# Patient Record
Sex: Female | Born: 1995 | Race: White | Hispanic: No | Marital: Single | Smoking: Never smoker
Health system: Southern US, Community
[De-identification: ages and names within clinical notes are randomized; demographics above are authoritative.]

## PROBLEM LIST (undated history)

## (undated) DIAGNOSIS — C9590 Leukemia, unspecified not having achieved remission: Secondary | ICD-10-CM

## (undated) HISTORY — PX: WISDOM TOOTH EXTRACTION: SHX21

## (undated) HISTORY — PX: APPENDECTOMY: SHX54

---
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
## 2015-01-18 ENCOUNTER — Encounter (HOSPITAL_COMMUNITY): Payer: Self-pay | Admitting: Emergency Medicine

## 2015-01-18 ENCOUNTER — Emergency Department (HOSPITAL_COMMUNITY): Payer: BLUE CROSS/BLUE SHIELD

## 2015-01-18 ENCOUNTER — Emergency Department (HOSPITAL_COMMUNITY)
Admission: EM | Admit: 2015-01-18 | Discharge: 2015-01-18 | Disposition: A | Discharge status: Other Acute Inpatient Hospital | Payer: BLUE CROSS/BLUE SHIELD | Attending: Emergency Medicine | Admitting: Emergency Medicine

## 2015-01-18 DIAGNOSIS — D709 Neutropenia, unspecified: Secondary | ICD-10-CM | POA: Diagnosis not present

## 2015-01-18 DIAGNOSIS — Z856 Personal history of leukemia: Secondary | ICD-10-CM | POA: Diagnosis not present

## 2015-01-18 DIAGNOSIS — R5081 Fever presenting with conditions classified elsewhere: Secondary | ICD-10-CM

## 2015-01-18 DIAGNOSIS — Z792 Long term (current) use of antibiotics: Secondary | ICD-10-CM | POA: Diagnosis not present

## 2015-01-18 DIAGNOSIS — R000 Tachycardia, unspecified: Secondary | ICD-10-CM | POA: Diagnosis not present

## 2015-01-18 DIAGNOSIS — R509 Fever, unspecified: Secondary | ICD-10-CM | POA: Diagnosis present

## 2015-01-18 HISTORY — DX: Leukemia, unspecified not having achieved remission: C95.90

## 2015-01-18 LAB — COMPREHENSIVE METABOLIC PANEL
ALBUMIN: 3.6 g/dL (ref 3.5–5.0)
ALT: 51 U/L (ref 14–54)
AST: 27 U/L (ref 15–41)
Alkaline Phosphatase: 75 U/L (ref 38–126)
Anion gap: 6 (ref 5–15)
BUN: 20 mg/dL (ref 6–20)
CO2: 25 mmol/L (ref 22–32)
CREATININE: 0.79 mg/dL (ref 0.44–1.00)
Calcium: 9.1 mg/dL (ref 8.9–10.3)
Chloride: 106 mmol/L (ref 101–111)
GFR calc Af Amer: >60 mL/min (ref >60)
GFR calc non Af Amer: >60 mL/min (ref >60)
Glucose, Bld: 85 mg/dL (ref 70–99)
POTASSIUM: 3.7 mmol/L (ref 3.5–5.1)
Sodium: 137 mmol/L (ref 135–145)
Total Bilirubin: 0.9 mg/dL (ref 0.3–1.2)
Total Protein: 5.8 g/dL — ABNORMAL LOW (ref 6.5–8.1)

## 2015-01-18 LAB — CBC WITH DIFFERENTIAL/PLATELET
BASOS ABS: 0 10*3/uL (ref 0.0–0.1)
Basophils Relative: 0 % (ref 0–1)
EOS ABS: 0 10*3/uL (ref 0.0–0.7)
Eosinophils Relative: 4 % (ref 0–5)
HCT: 27.2 % — ABNORMAL LOW (ref 36.0–46.0)
Hemoglobin: 9.7 g/dL — ABNORMAL LOW (ref 12.0–15.0)
LYMPHS PCT: 78 % — AB (ref 12–46)
Lymphs Abs: 0.8 10*3/uL (ref 0.7–4.0)
MCH: 29.5 pg (ref 26.0–34.0)
MCHC: 35.7 g/dL (ref 30.0–36.0)
MCV: 82.7 fL (ref 78.0–100.0)
MONO ABS: 0.1 10*3/uL (ref 0.1–1.0)
Monocytes Relative: 6 % (ref 3–12)
Neutro Abs: 0.1 10*3/uL — ABNORMAL LOW (ref 1.7–7.7)
Neutrophils Relative %: 12 % — ABNORMAL LOW (ref 43–77)
PLATELETS: 224 10*3/uL (ref 150–400)
RBC: 3.29 MIL/uL — ABNORMAL LOW (ref 3.87–5.11)
RDW: 12.8 % (ref 11.5–15.5)
WBC: 1 10*3/uL — AB (ref 4.0–10.5)

## 2015-01-18 LAB — URINALYSIS, ROUTINE W REFLEX MICROSCOPIC
Bilirubin Urine: NEGATIVE
GLUCOSE, UA: NEGATIVE mg/dL
Hgb urine dipstick: NEGATIVE
Ketones, ur: NEGATIVE mg/dL
LEUKOCYTES UA: NEGATIVE
Nitrite: NEGATIVE
PH: 6.5 (ref 5.0–8.0)
Protein, ur: NEGATIVE mg/dL
Specific Gravity, Urine: 1.015 (ref 1.005–1.030)
Urobilinogen, UA: 0.2 mg/dL (ref 0.0–1.0)

## 2015-01-18 LAB — I-STAT CG4 LACTIC ACID, ED
LACTIC ACID, VENOUS: 1.14 mmol/L (ref 0.5–2.0)
LACTIC ACID, VENOUS: 0.85 mmol/L (ref 0.5–2.0)

## 2015-01-18 LAB — PREGNANCY, URINE: PREG TEST UR: NEGATIVE

## 2015-01-18 MED ORDER — SODIUM CHLORIDE 0.9 % IV SOLN
1.0000 g | Freq: Three times a day (TID) | INTRAVENOUS | Status: DC
  Administered 2015-01-18: 1 g via INTRAVENOUS
  Filled 2015-01-18 (×2): qty 1

## 2015-01-18 MED ORDER — VANCOMYCIN HCL IN DEXTROSE 1-5 GM/200ML-% IV SOLN
1000.0000 mg | Freq: Once | INTRAVENOUS | Status: AC
  Administered 2015-01-18: 1000 mg via INTRAVENOUS
  Filled 2015-01-18: qty 200

## 2015-01-18 MED ORDER — SODIUM CHLORIDE 0.9 % IV BOLUS (SEPSIS)
500.0000 mL | INTRAVENOUS | Status: AC
  Administered 2015-01-18: 500 mL via INTRAVENOUS

## 2015-01-18 MED ORDER — SODIUM CHLORIDE 0.9 % IV BOLUS (SEPSIS)
1000.0000 mL | Freq: Once | INTRAVENOUS | Status: AC
  Administered 2015-01-18: 1000 mL via INTRAVENOUS

## 2015-01-18 NOTE — ED Notes (Signed)
Pt has B cell ALL leukemia.  Last chemo was Friday.  States that she woke up this morning with a fever of 100.5.  Pt states that she feels SOB.  States that she also is having gen abd pain.  Appendectomy happened during induction chemo.  Has been on zosyn for "quite a while" because she developed an abscess after this.

## 2015-01-18 NOTE — Progress Notes (Signed)
ANTIBIOTIC CONSULT NOTE - INITIAL  Pharmacy Consult for Meropenem Indication: Sepsis  No Known Allergies  Patient Measurements: Height: 5\' 6"  (167.6 cm) Weight: 110 lb (49.896 kg) IBW/kg (Calculated) : 59.3  Vital Signs: Temp: 98.8 F (37.1 C) (05/08 1157) Temp Source: Oral (05/08 1157) BP: 81/59 mmHg (05/08 1331) Pulse Rate: 101 (05/08 1331) Intake/Output from previous day:   Intake/Output from this shift:    Labs:  Recent Labs  01/18/15 1238  WBC 1.0*  HGB 9.7*  PLT 224  CREATININE 0.79   Estimated Creatinine Clearance: 89.8 mL/min (by C-G formula based on Cr of 0.79). No results for input(s): VANCOTROUGH, VANCOPEAK, VANCORANDOM, GENTTROUGH, GENTPEAK, GENTRANDOM, TOBRATROUGH, TOBRAPEAK, TOBRARND, AMIKACINPEAK, AMIKACINTROU, AMIKACIN in the last 72 hours.   Microbiology: No results found for this or any previous visit (from the past 720 hour(s)).  Medical History: Past Medical History  Diagnosis Date  . Leukemia     Medications:  Anti-infectives    Start     Dose/Rate Route Frequency Ordered Stop   01/18/15 1400  vancomycin (VANCOCIN) IVPB 1000 mg/200 mL premix     1,000 mg 200 mL/hr over 60 Minutes Intravenous  Once 01/18/15 1357       Assessment: 19yo F with ALL being treated at [facility], most recently treated on 5/6, presenting with fever and SOB. Vanc is ordered x 1 and pharmacy is asked to assist with meropenem dosing.   Afebrile.  WBCs low, ANC 0.1.  SCr wnl, CrCl 33ml/min.  Blood and urine cultures collected.  Goal of Therapy:  Appropriate antibiotic dosing for renal function; eradication of infection  Plan:  Agree with Meropenem 1g IV q8h as ordered. Follow up renal fxn, culture results, and clinical course.  Romeo Rabon, PharmD, pager [phone]. 01/18/2015,2:05 PM.

## 2015-01-18 NOTE — ED Provider Notes (Signed)
CSN: [id number]     Arrival date & time 01/18/15  1150 History   First MD Initiated Contact with Patient 01/18/15 1154     Chief Complaint  Patient presents with  . Fever  . Tachycardia  . Leukemia     (Consider location/radiation/quality/duration/timing/severity/associated sxs/prior Treatment) HPI Patient presents with concern of fever, weakness, dyspnea.  Dyspnea is worse w exertion. Patient has leukemia, is actively receiving chemotherapy. Last session was 2 days ago. Patient receives care at [facility]. Stated patient found herself to be febrile, after awakening. No new pain, though she has mild abdominal discomfort, generalized. No new bowel habit changes, baseline loose stool since initiation of chemotherapy, and recent appendectomy. Patient has 3 times daily Zosyn dosing.  Past Medical History  Diagnosis Date  . Leukemia    Past Surgical History  Procedure Laterality Date  . Wisdom tooth extraction    . Appendectomy     No family history on file. History  Substance Use Topics  . Smoking status: Never Smoker   . Smokeless tobacco: Not on file  . Alcohol Use: No     Comment: occasionally   OB History    No data available     Review of Systems  Constitutional:       Per HPI, otherwise negative  HENT:       Per HPI, otherwise negative  Respiratory:       Per HPI, otherwise negative  Cardiovascular:       Per HPI, otherwise negative  Gastrointestinal: Positive for diarrhea. Negative for vomiting.  Endocrine:       Negative aside from HPI  Genitourinary:       Neg aside from HPI   Musculoskeletal:       Per HPI, otherwise negative  Skin: Negative.   Neurological: Positive for weakness and light-headedness. Negative for syncope.      Allergies  Review of patient's allergies indicates no known allergies.  Home Medications   Prior to Admission medications   Medication Sig Start Date End Date Taking? Authorizing Provider  ACZONE 5 % topical gel Apply 1  application topically at bedtime.  09/28/14   Historical Provider, MD  ampicillin (PRINCIPEN) 500 MG capsule Take 1 capsule by mouth daily. For acne 10/28/14   Historical Provider, MD  ibuprofen (ADVIL,MOTRIN) 200 MG tablet Take 400 mg by mouth every 6 (six) hours as needed for moderate pain.    Historical Provider, MD   BP 94/73 mmHg  Pulse 120  Temp(Src) 98.8 F (37.1 C) (Oral)  Resp 20  SpO2 96% Physical Exam  Constitutional: She is oriented to person, place, and time. She appears well-developed and well-nourished. No distress.  HENT:  Head: Normocephalic and atraumatic.  Eyes: Conjunctivae and EOM are normal.  Cardiovascular: Regular rhythm.  Tachycardia present.   Pulmonary/Chest: Effort normal and breath sounds normal. No stridor. No respiratory distress.    Abdominal: She exhibits no distension.  Mild diffuse ttp, no guarding.  Musculoskeletal: She exhibits no edema.  Neurological: She is alert and oriented to person, place, and time. No cranial nerve deficit.  Skin: Skin is warm and dry.  Psychiatric: She has a normal mood and affect.  Nursing note and vitals reviewed.   ED Course  Procedures (including critical care time) Labs Review Labs Reviewed  CBC WITH DIFFERENTIAL/PLATELET - Abnormal; Notable for the following:    WBC 1.0 (*)    RBC 3.29 (*)    Hemoglobin 9.7 (*)    HCT 27.2 (*)  Neutrophils Relative % 12 (*)    Lymphocytes Relative 78 (*)    Neutro Abs 0.1 (*)    All other components within normal limits  COMPREHENSIVE METABOLIC PANEL - Abnormal; Notable for the following:    Total Protein 5.8 (*)    All other components within normal limits  CULTURE, BLOOD (ROUTINE X 2)  CULTURE, BLOOD (ROUTINE X 2)  URINE CULTURE  URINALYSIS, ROUTINE W REFLEX MICROSCOPIC  PREGNANCY, URINE  I-STAT CG4 LACTIC ACID, ED  I-STAT CG4 LACTIC ACID, ED    Imaging Review Dg Chest Port 1 View  01/18/2015   CLINICAL DATA:  Shortness of breath and fever today, history of  leukemia with last chemotherapy treatment on 01/16/2015  EXAM: PORTABLE CHEST - 1 VIEW  COMPARISON:  None  FINDINGS: LEFT subclavian Port-A-Cath with tip projecting over mid SVC.  Normal heart size, mediastinal contours and pulmonary vascularity.  Lungs clear.  No pleural effusion or pneumothorax.  Osseous structures unremarkable.  IMPRESSION: No acute abnormalities.   Electronically Signed   By: Lavonia Dana M.D.   On: 01/18/2015 14:08     EKG Interpretation   Date/Time:  Sunday Jan 18 2015 12:09:15 EDT Ventricular Rate:  121 PR Interval:  108 QRS Duration: 83 QT Interval:  386 QTC Calculation: 548 R Axis:   36 Text Interpretation:  Sinus tachycardia Borderline T abnormalities,  diffuse leads Prolonged QT interval Baseline wander in lead(s) V2 Sinus  tachycardia Artifact Abnormal ekg Confirmed by Carmin Muskrat  MD ([id number])  on 01/18/2015 12:26:43 PM     Cardiac: 121- st, abn O2- 99%ra, nml  BP initial low - IVF started  EMR review of OSH w leukopenia 2d ago.  Following return of initial labs, I rechecked the patient.  She remained tachycardic / hypotensive.  Sepsis had been called soon after her initial eval.   I spoke w [facility] peds oncology regarding her care.  We agreed on the broad scope of ABX for her neutropenic fever and transfer to tertiary care.  On re-exam, prior to transfer, the patient remained hypotensive, though awake and alert.  Parent have been involved throughout.  MDM   Final diagnoses:  Neutropenic fever   Young F w leukemia now p/w fever.  Initial VS consistent w sepsis, and with concern for relative immunosuppression 2/2 chemo, isolation, broad spectrum ABX provided.  Case discussed several times with the patient's oncology team at [facility].  With her need for higher level of care by those on her primary team, I arranged transfer.  CRITICAL CARE Performed by: Carmin Muskrat Total critical care time: 45 Critical care time was exclusive of separately billable  procedures and treating other patients. Critical care was necessary to treat or prevent imminent or life-threatening deterioration. Critical care was time spent personally by me on the following activities: development of treatment plan with patient and/or surrogate as well as nursing, discussions with consultants, evaluation of patient's response to treatment, examination of patient, obtaining history from patient or surrogate, ordering and performing treatments and interventions, ordering and review of laboratory studies, ordering and review of radiographic studies, pulse oximetry and re-evaluation of patient's condition.    Carmin Muskrat, MD 01/18/15 2320

## 2015-01-18 NOTE — ED Notes (Signed)
Dr Carney Harder [facility] [phone] called and has requested Blood cultures and CBC be obtained. Please notify her with results and/or questions.

## 2015-01-18 NOTE — ED Notes (Signed)
Waiting on nurse to apply pain spray and i will get second set of blood cultures

## 2015-01-18 NOTE — ED Notes (Signed)
Pt port already access with saline lock upon arrival. Pt reports that port was accessed at [facility] 2 days ago.

## 2015-01-18 NOTE — ED Notes (Addendum)
[facility] provided with image disk for [facility]. Report called to [facility] at [facility] and [place] notified that pt is on her way with transport.

## 2015-01-19 LAB — URINE CULTURE
CULTURE: NO GROWTH
Colony Count: NO GROWTH

## 2015-01-24 LAB — CULTURE, BLOOD (ROUTINE X 2)
CULTURE: NO GROWTH
Culture: NO GROWTH

## 2015-02-03 ENCOUNTER — Other Ambulatory Visit (HOSPITAL_COMMUNITY)
Admit: 2015-02-03 | Discharge: 2015-02-03 | Disposition: A | Discharge status: Home / Self Care | Payer: BLUE CROSS/BLUE SHIELD | Source: Other Acute Inpatient Hospital | Attending: Pediatric Hematology | Admitting: Pediatric Hematology

## 2015-02-03 DIAGNOSIS — R197 Diarrhea, unspecified: Secondary | ICD-10-CM | POA: Diagnosis not present

## 2015-02-03 LAB — CLOSTRIDIUM DIFFICILE BY PCR: Toxigenic C. Difficile by PCR: NEGATIVE

## 2015-02-05 ENCOUNTER — Encounter (HOSPITAL_COMMUNITY): Payer: Self-pay | Admitting: *Deleted

## 2015-02-05 ENCOUNTER — Emergency Department (HOSPITAL_COMMUNITY)
Admission: EM | Admit: 2015-02-05 | Discharge: 2015-02-05 | Disposition: A | Discharge status: Other Acute Inpatient Hospital | Payer: BLUE CROSS/BLUE SHIELD | Attending: Emergency Medicine | Admitting: Emergency Medicine

## 2015-02-05 DIAGNOSIS — R1031 Right lower quadrant pain: Secondary | ICD-10-CM | POA: Insufficient documentation

## 2015-02-05 DIAGNOSIS — Z79899 Other long term (current) drug therapy: Secondary | ICD-10-CM | POA: Diagnosis not present

## 2015-02-05 DIAGNOSIS — Z792 Long term (current) use of antibiotics: Secondary | ICD-10-CM | POA: Insufficient documentation

## 2015-02-05 DIAGNOSIS — R5383 Other fatigue: Secondary | ICD-10-CM | POA: Diagnosis not present

## 2015-02-05 DIAGNOSIS — R42 Dizziness and giddiness: Secondary | ICD-10-CM | POA: Insufficient documentation

## 2015-02-05 DIAGNOSIS — R1032 Left lower quadrant pain: Secondary | ICD-10-CM | POA: Diagnosis not present

## 2015-02-05 DIAGNOSIS — R0602 Shortness of breath: Secondary | ICD-10-CM | POA: Diagnosis not present

## 2015-02-05 DIAGNOSIS — Z3202 Encounter for pregnancy test, result negative: Secondary | ICD-10-CM | POA: Insufficient documentation

## 2015-02-05 DIAGNOSIS — R55 Syncope and collapse: Secondary | ICD-10-CM | POA: Insufficient documentation

## 2015-02-05 DIAGNOSIS — Z856 Personal history of leukemia: Secondary | ICD-10-CM | POA: Insufficient documentation

## 2015-02-05 DIAGNOSIS — R110 Nausea: Secondary | ICD-10-CM | POA: Diagnosis not present

## 2015-02-05 LAB — BASIC METABOLIC PANEL
Anion gap: 10 (ref 5–15)
BUN: 18 mg/dL (ref 6–20)
CHLORIDE: 99 mmol/L — AB (ref 101–111)
CO2: 28 mmol/L (ref 22–32)
CREATININE: 0.84 mg/dL (ref 0.44–1.00)
Calcium: 8.6 mg/dL — ABNORMAL LOW (ref 8.9–10.3)
GFR calc Af Amer: >60 mL/min (ref >60)
GFR calc non Af Amer: >60 mL/min (ref >60)
GLUCOSE: 96 mg/dL (ref 65–99)
Potassium: 4.3 mmol/L (ref 3.5–5.1)
Sodium: 137 mmol/L (ref 135–145)

## 2015-02-05 LAB — URINALYSIS, ROUTINE W REFLEX MICROSCOPIC
Bilirubin Urine: NEGATIVE
GLUCOSE, UA: NEGATIVE mg/dL
HGB URINE DIPSTICK: NEGATIVE
Ketones, ur: NEGATIVE mg/dL
Leukocytes, UA: NEGATIVE
NITRITE: NEGATIVE
PH: 7 (ref 5.0–8.0)
Protein, ur: NEGATIVE mg/dL
Specific Gravity, Urine: 1.007 (ref 1.005–1.030)
Urobilinogen, UA: 0.2 mg/dL (ref 0.0–1.0)

## 2015-02-05 LAB — CBC WITH DIFFERENTIAL/PLATELET
BASOS ABS: 0 10*3/uL (ref 0.0–0.1)
Basophils Relative: 0 % (ref 0–1)
Eosinophils Absolute: 0.2 10*3/uL (ref 0.0–0.7)
Eosinophils Relative: 3 % (ref 0–5)
HCT: 29.3 % — ABNORMAL LOW (ref 36.0–46.0)
HEMOGLOBIN: 9.5 g/dL — AB (ref 12.0–15.0)
LYMPHS PCT: 20 % (ref 12–46)
Lymphs Abs: 1 10*3/uL (ref 0.7–4.0)
MCH: 29.4 pg (ref 26.0–34.0)
MCHC: 32.4 g/dL (ref 30.0–36.0)
MCV: 90.7 fL (ref 78.0–100.0)
MONOS PCT: 3 % (ref 3–12)
Monocytes Absolute: 0.2 10*3/uL (ref 0.1–1.0)
Neutro Abs: 3.5 10*3/uL (ref 1.7–7.7)
Neutrophils Relative %: 74 % (ref 43–77)
PLATELETS: 190 10*3/uL (ref 150–400)
RBC: 3.23 MIL/uL — ABNORMAL LOW (ref 3.87–5.11)
RDW: 14.7 % (ref 11.5–15.5)
WBC: 4.8 10*3/uL (ref 4.0–10.5)

## 2015-02-05 LAB — HEPATIC FUNCTION PANEL
ALT: 211 U/L — ABNORMAL HIGH (ref 14–54)
AST: 92 U/L — ABNORMAL HIGH (ref 15–41)
Albumin: 3 g/dL — ABNORMAL LOW (ref 3.5–5.0)
Alkaline Phosphatase: 68 U/L (ref 38–126)
BILIRUBIN DIRECT: 0.1 mg/dL (ref 0.1–0.5)
Indirect Bilirubin: 0.6 mg/dL (ref 0.3–0.9)
Total Bilirubin: 0.7 mg/dL (ref 0.3–1.2)
Total Protein: 5.3 g/dL — ABNORMAL LOW (ref 6.5–8.1)

## 2015-02-05 LAB — POC URINE PREG, ED: Preg Test, Ur: NEGATIVE

## 2015-02-05 MED ORDER — SODIUM CHLORIDE 0.9 % IV SOLN
INTRAVENOUS | Status: DC
  Administered 2015-02-05: 22:00:00 via INTRAVENOUS

## 2015-02-05 MED ORDER — ONDANSETRON HCL 4 MG/2ML IJ SOLN
4.0000 mg | Freq: Once | INTRAMUSCULAR | Status: AC
Start: 2015-02-05 — End: 2015-02-05
  Administered 2015-02-05: 4 mg via INTRAVENOUS
  Filled 2015-02-05: qty 2

## 2015-02-05 MED ORDER — VANCOMYCIN HCL IN DEXTROSE 1-5 GM/200ML-% IV SOLN
1000.0000 mg | Freq: Once | INTRAVENOUS | Status: AC
Start: 2015-02-05 — End: 2015-02-05
  Administered 2015-02-05: 1000 mg via INTRAVENOUS
  Filled 2015-02-05: qty 200

## 2015-02-05 MED ORDER — SODIUM CHLORIDE 0.9 % IV BOLUS (SEPSIS)
1000.0000 mL | Freq: Once | INTRAVENOUS | Status: AC
  Administered 2015-02-05: 1000 mL via INTRAVENOUS

## 2015-02-05 MED ORDER — LORAZEPAM 0.5 MG PO TABS
0.5000 mg | ORAL_TABLET | Freq: Once | ORAL | Status: AC
  Administered 2015-02-05: 0.5 mg via ORAL
  Filled 2015-02-05: qty 1

## 2015-02-05 MED ORDER — PIPERACILLIN-TAZOBACTAM 3.375 G IVPB
3.3750 g | Freq: Once | INTRAVENOUS | Status: AC
  Administered 2015-02-05: 3.375 g via INTRAVENOUS
  Filled 2015-02-05: qty 50

## 2015-02-05 MED ORDER — ONDANSETRON HCL 4 MG/2ML IJ SOLN
4.0000 mg | Freq: Once | INTRAMUSCULAR | Status: AC
  Administered 2015-02-05: 4 mg via INTRAVENOUS
  Filled 2015-02-05: qty 2

## 2015-02-05 NOTE — ED Notes (Signed)
[facility], pt is still waiting on bed placement at this time, family updated on the same

## 2015-02-05 NOTE — ED Notes (Signed)
Pt c/o nausea at this time, Dr. Tomi Bamberger notified of the same, new order for zofran. Pt/family is aware we are currently awaiting bed placement/transport to [facility] for admission

## 2015-02-05 NOTE — ED Notes (Signed)
Report had been called [facility], and [facility] called. [facility] called back and reported transport is on hold at this time due to bed assignment change. [facility] notified of the same. Will update family at this time

## 2015-02-05 NOTE — ED Provider Notes (Signed)
CSN: [id number]     Arrival date & time 02/05/15  1344 History   First MD Initiated Contact with Patient 02/05/15 1355     Chief Complaint  Patient presents with  . Loss of Consciousness     (Consider location/radiation/quality/duration/timing/severity/associated sxs/prior Treatment) Patient is a 19 y.o. female presenting with syncope.  Loss of Consciousness Episode history:  Multiple Most recent episode:  Today Duration: a few seconds. Timing:  Intermittent Progression:  Resolved Chronicity:  New Context comment:  Hx of ALL, on chemo, also being treated for intraabdominal fluid collections with IV cefepime (last dose last night) Witnessed: yes (second episode witnessed by mother)   Worsened by:  Posture Associated symptoms: dizziness, malaise/fatigue, nausea and shortness of breath (occasionally, on exertion, felt by patient to be secondary to deconditioning.)   Associated symptoms: no chest pain, no fever, no focal weakness and no vomiting     Past Medical History  Diagnosis Date  . Leukemia    Past Surgical History  Procedure Laterality Date  . Wisdom tooth extraction    . Appendectomy     No family history on file. History  Substance Use Topics  . Smoking status: Never Smoker   . Smokeless tobacco: Not on file  . Alcohol Use: No     Comment: occasionally   OB History    No data available     Review of Systems  Constitutional: Positive for malaise/fatigue. Negative for fever.  Respiratory: Positive for shortness of breath (occasionally, on exertion, felt by patient to be secondary to deconditioning.).   Cardiovascular: Positive for syncope. Negative for chest pain.  Gastrointestinal: Positive for nausea. Negative for vomiting.  Neurological: Positive for dizziness. Negative for focal weakness.  All other systems reviewed and are negative.     Allergies  Review of patient's allergies indicates no known allergies.  Home Medications   Prior to Admission  medications   Medication Sig Start Date End Date Taking? Authorizing Provider  ACZONE 5 % topical gel Apply 1 application topically at bedtime.  09/28/14   Historical Provider, MD  Amino Acids (AMINO ACID PO) Take 1 capsule by mouth 2 (two) times daily.    Historical Provider, MD  cytarabine, PF, (ARA-C) 100 MG/ML injection Dose = 112 mg. IV push over 1 minute. Give on 4/16 to 4/18, and then 4/23 to 4/25. 12/26/14   Historical Provider, MD  dronabinol (MARINOL) 5 MG capsule Take 5 mg by mouth 2 (two) times daily before lunch and supper.  12/23/14   Historical Provider, MD  fluconazole (DIFLUCAN) 200 MG tablet Take 200 mg by mouth daily.  01/05/15   Historical Provider, MD  ibuprofen (ADVIL,MOTRIN) 200 MG tablet Take 400 mg by mouth every 6 (six) hours as needed for moderate pain.    Historical Provider, MD  leuprolide (LUPRON) 11.25 MG injection Inject 11.25 mg into the muscle every 3 (three) months.    Historical Provider, MD  levofloxacin (LEVAQUIN) 500 MG tablet Take 500 mg by mouth daily.  12/14/14   Historical Provider, MD  lidocaine-prilocaine (EMLA) cream Apply 1 application topically once.  11/10/14   Historical Provider, MD  Linaclotide Rolan Lipa) 145 MCG CAPS capsule Take 145 mcg by mouth daily.  12/23/14 01/22/15  Historical Provider, MD  loratadine (CLARITIN) 10 MG tablet Take 10 mg by mouth daily.    Historical Provider, MD  LORazepam (ATIVAN) 0.5 MG tablet Take 1-2 tabs by mouth every 6 hours as needed for nausea 11/13/14   Historical Provider, MD  mercaptopurine (PURINETHOL) 50 MG tablet Take orally at bedtime. Take 2 tablets on Mon-Thurs, & take 1.5 tablets on Fri - Sun. Take from 4/15 to 4/28, and from 5/13 to 5/26. 12/26/14   Historical Provider, MD  methotrexate (PF) 12 mg in sodium chloride 0.9 % 15 mg by Intrathecal route once.    Historical Provider, MD  ondansetron (ZOFRAN) 8 MG tablet Take 8 mg by mouth every 8 (eight) hours as needed for nausea or vomiting.  12/23/14   Historical Provider,  MD  polyethylene glycol (MIRALAX / GLYCOLAX) packet Take 17 g by mouth daily as needed for mild constipation.     Historical Provider, MD  scopolamine (TRANSDERM-SCOP) 1 MG/3DAYS Place 1.5 mg onto the skin every 3 (three) days.  12/23/14 12/23/15  Historical Provider, MD  sulfamethoxazole-trimethoprim (BACTRIM DS,SEPTRA DS) 800-160 MG per tablet Take 1 tablet by mouth See admin instructions. Take 1 tablet twice daily 01/16/15   Historical Provider, MD  vinCRIStine 2 mg in sodium chloride 0.9 % 50 mL Inject 2 mg into the vein once.    Historical Provider, MD   BP 114/79 mmHg  Pulse 88  Temp(Src) 98.4 F (36.9 C) (Oral)  Resp 17  SpO2 99% Physical Exam  Constitutional: She is oriented to person, place, and time. She appears well-developed and well-nourished. No distress.  HENT:  Head: Normocephalic and atraumatic.  Mouth/Throat: Oropharynx is clear and moist.  Eyes: Conjunctivae are normal. Pupils are equal, round, and reactive to light. No scleral icterus.  Neck: Neck supple.  Cardiovascular: Normal rate, regular rhythm, normal heart sounds and intact distal pulses.   No murmur heard. Pulmonary/Chest: Effort normal and breath sounds normal. No stridor. No respiratory distress. She has no rales.  Abdominal: Soft. Bowel sounds are normal. She exhibits no distension. There is tenderness (mild) in the right lower quadrant and suprapubic area. There is no rigidity, no rebound and no guarding.  Musculoskeletal: Normal range of motion.  Neurological: She is alert and oriented to person, place, and time.  Skin: Skin is warm and dry. No rash noted.  Psychiatric: She has a normal mood and affect. Her behavior is normal.  Nursing note and vitals reviewed.   ED Course  Procedures (including critical care time) Labs Review Labs Reviewed  CBC WITH DIFFERENTIAL/PLATELET - Abnormal; Notable for the following:    RBC 3.23 (*)    Hemoglobin 9.5 (*)    HCT 29.3 (*)    All other components within normal  limits  BASIC METABOLIC PANEL - Abnormal; Notable for the following:    Chloride 99 (*)    Calcium 8.6 (*)    All other components within normal limits  HEPATIC FUNCTION PANEL - Abnormal; Notable for the following:    Total Protein 5.3 (*)    Albumin 3.0 (*)    AST 92 (*)    ALT 211 (*)    All other components within normal limits  CULTURE, BLOOD (SINGLE)  URINALYSIS, ROUTINE W REFLEX MICROSCOPIC (NOT AT [facility])  POC URINE PREG, ED    Imaging Review No results found.   EKG Interpretation   Date/Time:  Thursday Feb 05 2015 17:28:14 EDT Ventricular Rate:  79 PR Interval:  169 QRS Duration: 89 QT Interval:  385 QTC Calculation: 441 R Axis:   66 Text Interpretation:  Sinus rhythm RSR' in V1 or V2, right VCD or RVH  borderline t wave abnormality improved compared to prior. QTc normal.   Confirmed by [facility]  MD, TREY ([id number]) on  02/05/2015 5:31:26 PM      MDM   Final diagnoses:  Syncope, unspecified syncope type    19 yo female with hx of ALL presenting after syncope.  Occurred twice in succession after standing up and then after sitting up.  She bumped her head during the first episode and was lowered to the ground by mom during the second.    From a head injury perspective, I don't think she needs imaging.  She is well appearing, alert, no vomiting, no headache, no evidence of head trauma, and no head tenderness.  Likewise with her c spine.  She has a normal platelet count.    From a syncope perspective, I spoke with her oncology team Jarrett Soho) from [facility], who offered recommendations based on their previous history with pt.  She recommended blood culture, IV Vanco and Zosyn, fluids, and anticipated transfer to [facility].    She appears well appearing and nontoxic.  She does not have an acute abdomen or even significant tenderness.  I spoke with Dr. Cheri Rous at [facility], who has accepted pt in transfer.  ,4:55 PM On recheck, BP has dropped to 87/45.  She remains well appearing, nontoxic, with  normal HR.  Will give another bolus and watch.  Care transferred to Dr. Leonides Schanz pending her transfer to [facility].      Serita Grit, MD 02/05/15 [phone]

## 2015-02-05 NOTE — ED Notes (Addendum)
Pt from home.  Hx leukemia and on oral chemo.  Per ems report, pt started new appetite stimulating med yesterday and had 2 syncopal episodes today.  On ems arrival, VSS, AxOx4, ST 108-118, , 18, 100%RA, CBG 90. Per patient she had been having diarrhea x 3 days from abx she had been taking.  Her abx was d/c'd by her MD and today her stool was normal.  Pt reports dizziness and states she feels she hit her head pretty hard when she passed out earlier.  States her Marinol sometimes makes her dizzy. Currently she is axox4 and that her vision is a bit blurry and her mouth is dry and tailbone is sore from falling.

## 2015-02-05 NOTE — ED Notes (Signed)
ED Provider at patient bedside for assessment/update.   

## 2015-02-11 LAB — CULTURE, BLOOD (SINGLE): Culture: NO GROWTH

## 2015-06-18 ENCOUNTER — Emergency Department (HOSPITAL_COMMUNITY)
Admission: EM | Admit: 2015-06-18 | Discharge: 2015-06-18 | Payer: BLUE CROSS/BLUE SHIELD | Attending: Emergency Medicine | Admitting: Emergency Medicine

## 2015-06-18 ENCOUNTER — Encounter (HOSPITAL_COMMUNITY): Payer: Self-pay | Admitting: Family Medicine

## 2015-06-18 DIAGNOSIS — Z79899 Other long term (current) drug therapy: Secondary | ICD-10-CM | POA: Insufficient documentation

## 2015-06-18 DIAGNOSIS — R5383 Other fatigue: Secondary | ICD-10-CM | POA: Insufficient documentation

## 2015-06-18 DIAGNOSIS — R61 Generalized hyperhidrosis: Secondary | ICD-10-CM | POA: Insufficient documentation

## 2015-06-18 DIAGNOSIS — R630 Anorexia: Secondary | ICD-10-CM | POA: Insufficient documentation

## 2015-06-18 DIAGNOSIS — R6883 Chills (without fever): Secondary | ICD-10-CM | POA: Insufficient documentation

## 2015-06-18 DIAGNOSIS — R112 Nausea with vomiting, unspecified: Secondary | ICD-10-CM | POA: Insufficient documentation

## 2015-06-18 DIAGNOSIS — R1084 Generalized abdominal pain: Secondary | ICD-10-CM | POA: Diagnosis not present

## 2015-06-18 DIAGNOSIS — D701 Agranulocytosis secondary to cancer chemotherapy: Secondary | ICD-10-CM | POA: Diagnosis not present

## 2015-06-18 DIAGNOSIS — E872 Acidosis, unspecified: Secondary | ICD-10-CM

## 2015-06-18 DIAGNOSIS — C9590 Leukemia, unspecified not having achieved remission: Secondary | ICD-10-CM | POA: Diagnosis not present

## 2015-06-18 DIAGNOSIS — R231 Pallor: Secondary | ICD-10-CM | POA: Diagnosis not present

## 2015-06-18 DIAGNOSIS — T451X5A Adverse effect of antineoplastic and immunosuppressive drugs, initial encounter: Secondary | ICD-10-CM

## 2015-06-18 DIAGNOSIS — R531 Weakness: Secondary | ICD-10-CM | POA: Diagnosis not present

## 2015-06-18 LAB — CBC WITH DIFFERENTIAL/PLATELET
BASOS ABS: 0 10*3/uL (ref 0.0–0.1)
Basophils Relative: 1 %
Eosinophils Absolute: 0 10*3/uL (ref 0.0–0.7)
Eosinophils Relative: 0 %
HCT: 38.7 % (ref 36.0–46.0)
Hemoglobin: 13.4 g/dL (ref 12.0–15.0)
LYMPHS ABS: 1.5 10*3/uL (ref 0.7–4.0)
Lymphocytes Relative: 80 %
MCH: 33 pg (ref 26.0–34.0)
MCHC: 34.6 g/dL (ref 30.0–36.0)
MCV: 95.3 fL (ref 78.0–100.0)
Monocytes Absolute: 0.1 10*3/uL (ref 0.1–1.0)
Monocytes Relative: 4 %
Neutro Abs: 0.3 10*3/uL — ABNORMAL LOW (ref 1.7–7.7)
Neutrophils Relative %: 15 %
Platelets: 28 10*3/uL — CL (ref 150–400)
RBC: 4.06 MIL/uL (ref 3.87–5.11)
RDW: 17.9 % — AB (ref 11.5–15.5)
WBC: 1.9 10*3/uL — AB (ref 4.0–10.5)

## 2015-06-18 LAB — COMPREHENSIVE METABOLIC PANEL
ALT: 103 U/L — ABNORMAL HIGH (ref 14–54)
AST: 54 U/L — AB (ref 15–41)
Albumin: 3.1 g/dL — ABNORMAL LOW (ref 3.5–5.0)
Alkaline Phosphatase: 83 U/L (ref 38–126)
Anion gap: 10 (ref 5–15)
BILIRUBIN TOTAL: 0.5 mg/dL (ref 0.3–1.2)
BUN: 36 mg/dL — AB (ref 6–20)
CHLORIDE: 96 mmol/L — AB (ref 101–111)
CO2: 26 mmol/L (ref 22–32)
CREATININE: 0.79 mg/dL (ref 0.44–1.00)
Calcium: 8.1 mg/dL — ABNORMAL LOW (ref 8.9–10.3)
GFR calc non Af Amer: >60 mL/min (ref >60)
Glucose, Bld: 94 mg/dL (ref 65–99)
POTASSIUM: 4.3 mmol/L (ref 3.5–5.1)
Sodium: 132 mmol/L — ABNORMAL LOW (ref 135–145)
TOTAL PROTEIN: 4.9 g/dL — AB (ref 6.5–8.1)

## 2015-06-18 LAB — LIPASE, BLOOD: LIPASE: 15 U/L — AB (ref 22–51)

## 2015-06-18 LAB — I-STAT CG4 LACTIC ACID, ED: LACTIC ACID, VENOUS: 3.2 mmol/L — AB (ref 0.5–2.0)

## 2015-06-18 MED ORDER — SODIUM CHLORIDE 0.9 % IV BOLUS (SEPSIS)
1000.0000 mL | Freq: Once | INTRAVENOUS | Status: AC
  Administered 2015-06-18: 1000 mL via INTRAVENOUS

## 2015-06-18 MED ORDER — PIPERACILLIN-TAZOBACTAM 3.375 G IVPB
3.3750 g | Freq: Once | INTRAVENOUS | Status: AC
  Administered 2015-06-18: 3.375 g via INTRAVENOUS
  Filled 2015-06-18: qty 50

## 2015-06-18 MED ORDER — ONDANSETRON HCL 4 MG/2ML IJ SOLN
4.0000 mg | Freq: Once | INTRAMUSCULAR | Status: AC
  Administered 2015-06-18: 4 mg via INTRAVENOUS
  Filled 2015-06-18: qty 2

## 2015-06-18 MED ORDER — LORAZEPAM 2 MG/ML IJ SOLN
1.0000 mg | Freq: Once | INTRAMUSCULAR | Status: AC
  Administered 2015-06-18: 1 mg via INTRAVENOUS
  Filled 2015-06-18: qty 1

## 2015-06-18 MED ORDER — ACETAMINOPHEN 500 MG PO TABS: 500.0000 mg | ORAL_TABLET | Freq: Once | ORAL | Status: DC

## 2015-06-18 MED ORDER — ACETAMINOPHEN 160 MG/5ML PO SOLN
500.0000 mg | Freq: Once | ORAL | Status: AC
  Administered 2015-06-18: 500 mg via ORAL
  Filled 2015-06-18: qty 20

## 2015-06-18 MED ORDER — DEXAMETHASONE SODIUM PHOSPHATE 4 MG/ML IJ SOLN
4.0000 mg | Freq: Once | INTRAMUSCULAR | Status: AC
  Administered 2015-06-18: 4 mg via INTRAVENOUS
  Filled 2015-06-18: qty 1

## 2015-06-18 NOTE — ED Notes (Signed)
Bed: WU88 Expected date:  Expected time:  Means of arrival:  Comments: EMS 19yo F abd pain / vomiting

## 2015-06-18 NOTE — ED Notes (Signed)
Patient is from home and transported by [facility]. Pt has leukemia and seeking chemo. Last chemo treatment was 2 weeks ago. Pt has a new onset of nausea, vomiting, abd pain, and joint pain. Started yesterday, mid day. Pt states she completed the dosages of steroids.  Pt was given ZOFRAN 4 mg IV enroute by EMS.

## 2015-06-18 NOTE — ED Notes (Signed)
Report given to Gerald Stabs, Therapist, sports at [facility]

## 2015-06-18 NOTE — ED Provider Notes (Signed)
CSN: [id number]     Arrival date & time 06/18/15  1856 History   First MD Initiated Contact with Patient 06/18/15 [phone]     Chief Complaint  Patient presents with  . Emesis  . Joint Pain  . Abdominal Pain     (Consider location/radiation/quality/duration/timing/severity/associated sxs/prior Treatment) HPI Young female with leukemia presents with diffuse myalgia, arthralgia, nausea, weakness, vomiting. Last chemotherapy session was 2 weeks ago. This episode of illness began 2 days ago, since onset has been worsening. No focal pain, though there is diffuse abdominal soreness. No confusion, disorientation, syncope. No diarrhea, last bowel movement was yesterday, unremarkable. In addition to the patient's ongoing chemotherapy, patient had previously been on chronic dexamethasone. Last dose 3 days ago. Since onset of illness, no relief with anything, including Zofran. Patient is accompanied by her mother who assists with the history of present illness.  Past Medical History  Diagnosis Date  . Leukemia [facility])    Past Surgical History  Procedure Laterality Date  . Wisdom tooth extraction    . Appendectomy     History reviewed. No pertinent family history. Social History  Substance Use Topics  . Smoking status: Never Smoker   . Smokeless tobacco: None  . Alcohol Use: No   OB History    No data available     Review of Systems  Constitutional: Positive for chills, diaphoresis, activity change, appetite change and fatigue.  HENT:       Per HPI, otherwise negative  Respiratory:       Per HPI, otherwise negative  Cardiovascular:       Per HPI, otherwise negative  Gastrointestinal: Positive for nausea and vomiting.  Endocrine:       Negative aside from HPI  Genitourinary:       Neg aside from HPI   Musculoskeletal:       Per HPI, otherwise negative  Skin: Positive for pallor.  Allergic/Immunologic: Positive for immunocompromised state.  Neurological: Positive for  weakness. Negative for syncope.      Allergies  Ibuprofen  Home Medications   Prior to Admission medications   Medication Sig Start Date End Date Taking? Authorizing Provider  ACZONE 5 % topical gel Apply 1 application topically at bedtime as needed (acne).  09/28/14   Historical Provider, MD  Amino Acids (AMINO ACID PO) Take 1 capsule by mouth 2 (two) times daily.    Historical Provider, MD  cytarabine, PF, (ARA-C) 100 MG/ML injection Dose = 112 mg. IV push over 1 minute. Give on 4/16 to 4/18, and then 4/23 to 4/25. 12/26/14   Historical Provider, MD  dronabinol (MARINOL) 5 MG capsule Take 5 mg by mouth 2 (two) times daily as needed (appetite).  12/23/14   Historical Provider, MD  famotidine (PEPCID) 20 MG tablet Take 20 mg by mouth 2 (two) times daily.    Historical Provider, MD  fluconazole (DIFLUCAN) 200 MG tablet Take 200 mg by mouth daily.  01/05/15   Historical Provider, MD  leuprolide (LUPRON) 11.25 MG injection Inject 11.25 mg into the muscle every 3 (three) months.    Historical Provider, MD  levofloxacin (LEVAQUIN) 500 MG tablet Take 500 mg by mouth daily.  12/14/14   Historical Provider, MD  lidocaine-prilocaine (EMLA) cream Apply 1 application topically once.  11/10/14   Historical Provider, MD  loratadine (CLARITIN) 10 MG tablet Take 10 mg by mouth daily as needed for allergies.     Historical Provider, MD  LORazepam (ATIVAN) 0.5 MG tablet Take 1-2 tabs  by mouth every 6 hours as needed for nausea 11/13/14   Historical Provider, MD  mercaptopurine (PURINETHOL) 50 MG tablet Take orally at bedtime. Take 2 tablets on Mon-Thurs, & take 1.5 tablets on Fri - Sun. Take from 4/15 to 4/28, and from 5/13 to 5/26. 12/26/14   Historical Provider, MD  methotrexate (PF) 12 mg in sodium chloride 0.9 % 15 mg by Intrathecal route once.    Historical Provider, MD  methotrexate (RHEUMATREX) 2.5 MG tablet Take 30 mg by mouth once a week. Wednesday, except on weeks of spinal taps 01/26/15   Historical  Provider, MD  ondansetron (ZOFRAN) 8 MG tablet Take 8 mg by mouth every 8 (eight) hours as needed for nausea or vomiting.  12/23/14   Historical Provider, MD  polyethylene glycol (MIRALAX / GLYCOLAX) packet Take 17 g by mouth daily as needed for mild constipation.     Historical Provider, MD  predniSONE (DELTASONE) 20 MG tablet Take 1.5 tablets by mouth 2 (two) times daily. 5 days 01/26/15   Historical Provider, MD  scopolamine (TRANSDERM-SCOP) 1 MG/3DAYS Place 1.5 mg onto the skin every three (3) days as needed (nausea/vomitting).  12/23/14 12/23/15  Historical Provider, MD  sulfamethoxazole-trimethoprim (BACTRIM DS,SEPTRA DS) 800-160 MG per tablet Take 1 tablet by mouth 3 (three) times a week. Take 1 tablet twice daily 3 times a week, Monday, Tuesday, and Wednesday 01/16/15   Historical Provider, MD  vinCRIStine 2 mg in sodium chloride 0.9 % 50 mL Inject 2 mg into the vein once.    Historical Provider, MD   BP 108/69 mmHg  Pulse 104  Temp(Src) 98.9 F (37.2 C) (Oral)  Resp 20  Ht 5\' 7"  (1.702 m)  Wt 120 lb (54.432 kg)  BMI 18.79 kg/m2  SpO2 100% Physical Exam  Constitutional: She is oriented to person, place, and time. She has a sickly appearance.  HENT:  Head: Normocephalic and atraumatic.  Eyes: Conjunctivae and EOM are normal.  Cardiovascular: Normal rate and regular rhythm.   Pulmonary/Chest: Effort normal and breath sounds normal. No stridor. No respiratory distress.  Chemotherapy port, in upper chest, unremarkable  Abdominal: She exhibits no distension.    Musculoskeletal: She exhibits no edema.       Arms: Neurological: She is alert and oriented to person, place, and time. No cranial nerve deficit.  Skin: She is diaphoretic. There is pallor.  Psychiatric: She has a normal mood and affect.  Nursing note and vitals reviewed.   ED Course  Procedures (including critical care time) Labs Review Labs Reviewed  I-STAT CG4 LACTIC ACID, ED - Abnormal; Notable for the following:     Lactic Acid, Venous 3.20 (*)    All other components within normal limits  COMPREHENSIVE METABOLIC PANEL  LIPASE, BLOOD  CBC WITH DIFFERENTIAL/PLATELET  URINALYSIS, ROUTINE W REFLEX MICROSCOPIC (NOT AT [facility])    Imaging Review No results found. I have personally reviewed and evaluated these images and lab results as part of my medical decision-making.  Cardiac: 110 - st, abn  O2-99%ra, nml  EMR review is notable for one visit six months ago during which I evaluated the patient.  She required transfer to [facility].  CLINIC NOTE  Lorin Picket, MD - 06/02/2015 4:46 PM EDT Formatting of this note may be different from the original. Followup Visit Note  Patient Name: Rosemae Mcquown Age/Gender: 19 y.o. female Encounter Date: 06/02/2015  Assessment/Plan: 87. 19 year old with [place] ALL in her first complete continuous remission in Delayed Intensification clinically  doing well - day 8.  PLAN 1. Hematology/Oncology. Kaymarie received in clinic today vincristine and dauno IV without complications. She is to completed a 7 day course of dexamethasone 8mg  PO BID (last dose Monday 06/01/15). Labs are stable without transfusion requirements. Platelets are starting to fall. Recheck counts Friday 2. ID. Continue septra twice daily every Monday, Tuesday, Wednesday, and she can stop the daily flucon and famotidine tomorrow as she is off steroids. She was reminded to seek medical attention for onset of fevers.  3. GI. Marcele has available antiemetics (ondansetron, lorazepam, scopolamine) and stool-softeners/laxatives (colace, senna, miralax) to use as needed to prevent constipation. She will continue on her famotidine twice daily.  4. GYN. Receives lupron every 3 months, last received 03/13/15, due again 06/05/15.  5. Follow Up. Regnia will return to clinic for scheduled chemotherapy on Tuesday 06/02/15.   7:59 AM Labs notable for pancytopenia. Patient remains tachy  With concern for neutropenic fever, Zosyn was  started, IVF continued.  I d/w the patient's Onc team at [facility].  We agreed on ABX, steroids, transfer MDM   Final diagnoses:  Chemotherapy-induced neutropenia (HCC)  Non-intractable vomiting with nausea, vomiting of unspecified type  Lactic acidosis   Young F w ALL p/w neutropenia, n/v, tachycardia and fever.  Given this constellation of findings in a immunocompromised patient, she received monitoring, IVF, ABX, bolus steroids (with consideration of adrenal insufficiency given her recent steroid use).  Patient's n/v improved w zofran, benzodiapepine.  With neutropenic fever, critical illness, she requires transfer to her oncology facility.  CRITICAL CARE Performed by: Carmin Muskrat Total critical care time: 45 Critical care time was exclusive of separately billable procedures and treating other patients. Critical care was necessary to treat or prevent imminent or life-threatening deterioration. Critical care was time spent personally by me on the following activities: development of treatment plan with patient and/or surrogate as well as nursing, discussions with consultants, evaluation of patient's response to treatment, examination of patient, obtaining history from patient or surrogate, ordering and performing treatments and interventions, ordering and review of laboratory studies, ordering and review of radiographic studies, pulse oximetry and re-evaluation of patient's condition.   Carmin Muskrat, MD 06/18/15 2142

## 2015-06-23 LAB — CULTURE, BLOOD (ROUTINE X 2)
CULTURE: NO GROWTH
CULTURE: NO GROWTH

## 2017-04-15 ENCOUNTER — Emergency Department (HOSPITAL_COMMUNITY)
Admission: EM | Admit: 2017-04-15 | Discharge: 2017-04-15 | Disposition: A | Discharge status: Other Acute Inpatient Hospital | Payer: BLUE CROSS/BLUE SHIELD | Attending: Emergency Medicine | Admitting: Emergency Medicine

## 2017-04-15 ENCOUNTER — Encounter (HOSPITAL_COMMUNITY): Payer: Self-pay | Admitting: Emergency Medicine

## 2017-04-15 DIAGNOSIS — R5081 Fever presenting with conditions classified elsewhere: Secondary | ICD-10-CM | POA: Diagnosis not present

## 2017-04-15 DIAGNOSIS — D709 Neutropenia, unspecified: Secondary | ICD-10-CM | POA: Insufficient documentation

## 2017-04-15 DIAGNOSIS — J029 Acute pharyngitis, unspecified: Secondary | ICD-10-CM | POA: Diagnosis not present

## 2017-04-15 DIAGNOSIS — Z79899 Other long term (current) drug therapy: Secondary | ICD-10-CM | POA: Insufficient documentation

## 2017-04-15 DIAGNOSIS — R509 Fever, unspecified: Secondary | ICD-10-CM | POA: Diagnosis present

## 2017-04-15 LAB — BASIC METABOLIC PANEL
Anion gap: 7 (ref 5–15)
BUN: 11 mg/dL (ref 6–20)
CO2: 25 mmol/L (ref 22–32)
Calcium: 7.8 mg/dL — ABNORMAL LOW (ref 8.9–10.3)
Chloride: 109 mmol/L (ref 101–111)
Creatinine, Ser: 0.44 mg/dL (ref 0.44–1.00)
GFR calc Af Amer: >60 mL/min (ref >60)
GFR calc non Af Amer: >60 mL/min (ref >60)
Glucose, Bld: 87 mg/dL (ref 65–99)
Potassium: 3.7 mmol/L (ref 3.5–5.1)
Sodium: 141 mmol/L (ref 135–145)

## 2017-04-15 LAB — CBC WITH DIFFERENTIAL/PLATELET
Basophils Absolute: 0 10*3/uL (ref 0.0–0.1)
Basophils Relative: 1 %
Eosinophils Absolute: 0 10*3/uL (ref 0.0–0.7)
Eosinophils Relative: 1 %
HCT: 22.8 % — ABNORMAL LOW (ref 36.0–46.0)
Hemoglobin: 7.8 g/dL — ABNORMAL LOW (ref 12.0–15.0)
Lymphocytes Relative: 65 %
Lymphs Abs: 0.8 10*3/uL (ref 0.7–4.0)
MCH: 36.8 pg — ABNORMAL HIGH (ref 26.0–34.0)
MCHC: 34.2 g/dL (ref 30.0–36.0)
MCV: 107.5 fL — ABNORMAL HIGH (ref 78.0–100.0)
Monocytes Absolute: 0.3 10*3/uL (ref 0.1–1.0)
Monocytes Relative: 31 %
Neutro Abs: 0 10*3/uL — ABNORMAL LOW (ref 1.7–7.7)
Neutrophils Relative %: 2 %
Platelets: 74 10*3/uL — ABNORMAL LOW (ref 150–400)
RBC: 2.12 MIL/uL — ABNORMAL LOW (ref 3.87–5.11)
RDW: 22.8 % — ABNORMAL HIGH (ref 11.5–15.5)
WBC: 1.1 10*3/uL — CL (ref 4.0–10.5)

## 2017-04-15 MED ORDER — HEPARIN SOD (PORK) LOCK FLUSH 100 UNIT/ML IV SOLN
INTRAVENOUS | Status: AC
  Filled 2017-04-15: qty 5

## 2017-04-15 MED ORDER — DEXTROSE-NACL 5-0.9 % IV SOLN
INTRAVENOUS | Status: DC
  Administered 2017-04-15: 08:00:00 via INTRAVENOUS

## 2017-04-15 MED ORDER — HEPARIN SOD (PORK) LOCK FLUSH 100 UNIT/ML IV SOLN
500.0000 [IU] | Freq: Once | INTRAVENOUS | Status: AC
  Administered 2017-04-15: 500 [IU]

## 2017-04-15 MED ORDER — DEXTROSE 5 % IV SOLN
2.0000 g | Freq: Once | INTRAVENOUS | Status: AC
  Administered 2017-04-15: 2 g via INTRAVENOUS
  Filled 2017-04-15: qty 2

## 2017-04-15 MED ORDER — ACETAMINOPHEN 500 MG PO TABS
1000.0000 mg | ORAL_TABLET | Freq: Once | ORAL | Status: AC
  Administered 2017-04-15: 1000 mg via ORAL
  Filled 2017-04-15: qty 2

## 2017-04-15 NOTE — ED Provider Notes (Signed)
[facility] DEPT Provider Note   CSN: [id number] Arrival date & time: 04/15/17  0716     History   Chief Complaint Chief Complaint  Patient presents with  . Fever    HPI April Dominguez is a 21 y.o. 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 She denies difficulty swallowing, drooling, facial swelling, or shortness of breath. She has been able to tolerate PO without difficulty and denies decreased oral intake. Pain is moderate and worsens with swallowing. Awoke with a fever of 100.78F this morning. Has not tried anything for her symptoms. She denies headaches, neck pain, neck stiffness, chest pain, abdominal pain, nausea, vomiting, or diarrhea. She received her last chemotherapy treatment one month ago. Port is currently in place.  The history is provided by the patient.    Past Medical History:  Diagnosis Date  . Leukemia ([place])     There are no active problems to display for this patient.   Past Surgical History:  Procedure Laterality Date  . APPENDECTOMY    . WISDOM TOOTH EXTRACTION      OB History    No data available       Home Medications    Prior to Admission medications   Medication Sig Start Date End Date Taking? Authorizing Provider  Calcium Carbonate-Vitamin D 600-400 MG-UNIT tablet Take 1 tablet by mouth daily.   Yes [provider]  cetirizine (ZYRTEC) 10 MG tablet Take 10 mg by mouth daily.   Yes [provider]  LYRICA 25 MG capsule TAKE 2 CAPSULES EVERY MORNING AND TAKE 1 CAPSULE AT BEDTIME 03/24/17  Yes [provider]    Family History No family history on file.  Social History Social History  Substance Use Topics  . Smoking status: Never Smoker  . Smokeless tobacco: Never Used  . Alcohol use No     Allergies   Ibuprofen and Vancomycin   Review of Systems Review of Systems  Constitutional: Positive for  fever.  HENT: Positive for sore throat. Negative for congestion, sinus pain, sinus pressure, sneezing and trouble swallowing.   Respiratory: Negative for cough and shortness of breath.   Cardiovascular: Negative for chest pain.  Gastrointestinal: Negative for abdominal pain, diarrhea, nausea and vomiting.  Musculoskeletal: Negative for neck pain and neck stiffness.  Allergic/Immunologic: Positive for immunocompromised state.  All other systems reviewed and are negative.    Physical Exam Updated Vital Signs BP 111/71 (BP Location: Left Arm)   Pulse 92   Temp 99.3 F (37.4 C) (Oral)   Resp 18   Wt 54.4 kg (120 lb)   SpO2 100%   BMI 18.79 kg/m   Physical Exam  Constitutional: She is oriented to person, place, and time. She appears well-developed and well-nourished. No distress.  Resting comfortably in bed  HENT:  Head: Normocephalic and atraumatic.  Right Ear: External ear normal.  Left Ear: External ear normal.  Nose: Nose normal.  TMs normal bilaterally. No frontal or maxillary sinus TTP. Posterior oropharynx with mild erythema and tonsillar hypertrophy. No exudates or uvular deviation noted. No trismus or sublingual abnormalities.  Eyes: Pupils are equal, round, and reactive to light. Conjunctivae are normal. Right eye exhibits no discharge. Left eye exhibits no discharge.  Neck: Normal range of motion. Neck supple. No JVD present. No tracheal deviation present.  Cardiovascular: Regular rhythm and normal heart sounds.   Tachycardic, 2+ radial pulses bilaterally  Pulmonary/Chest: Effort normal and breath sounds normal.  Port in place [place]. No erythema, induration, or drainage. No tenderness to palpation  Abdominal: Soft. Bowel sounds are normal. She exhibits no distension. There is no tenderness.  Musculoskeletal: She exhibits no edema.  Neurological: She is alert and oriented to person, place, and time.  Skin: Skin is warm and dry. Capillary refill takes less than 2  seconds. No erythema.  Psychiatric: She has a normal mood and affect. Her behavior is normal.  Nursing note and vitals reviewed.    ED Treatments / Results  Labs (all labs ordered are listed, but only abnormal results are displayed) Labs Reviewed  CBC WITH DIFFERENTIAL/PLATELET - Abnormal; Notable for the following:       Result Value   WBC 1.1 (*)    RBC 2.12 (*)    Hemoglobin 7.8 (*)    HCT 22.8 (*)    MCV 107.5 (*)    MCH 36.8 (*)    RDW 22.8 (*)    Platelets 74 (*)    All other components within normal limits  BASIC METABOLIC PANEL - Abnormal; Notable for the following:    Calcium 7.8 (*)    All other components within normal limits  CULTURE, BLOOD (SINGLE)    EKG  EKG Interpretation None       Radiology No results found.  Procedures Procedures (including critical care time)  Medications Ordered in ED Medications  dextrose 5 %-0.9 % sodium chloride infusion ( Intravenous New Bag/Given 04/15/17 0826)  ceFEPIme (MAXIPIME) 2 g in dextrose 5 % 50 mL IVPB (0 g Intravenous Stopped 04/15/17 0910)  acetaminophen (TYLENOL) tablet 1,000 mg (1,000 mg Oral Given 04/15/17 0854)     Initial Impression / Assessment and Plan / ED Course  I have reviewed the triage vital signs and the nursing notes.  Pertinent labs & imaging results that were available during my care of the patient were reviewed by me and considered in my medical decision making (see chart for details).     Patient with complaint of sore throat and fever with history of ALL. Febrile at 100F in the ED and slightly tachycardic. No evidence of PTA or soft tissue infection. Airway is patent, no drooling, and patient able to tolerate PO in the ED without difficulty. Port in place without evidence of surrounding infection. Patient appears well-hydrated. Spoke with Dr. Celene Squibb, fellow at [facility] hematology oncology who recommends single blood culture from port, CBC BMP, cefepime and D5 normal saline  maintenance fluids. He will assume care of patient and she was brought into the hospital for management of febrile neutropenia. Patient is stable for discharge with transport to this facility via private vehicle per Dr. Hampton Abbot. Admitting team is aware.   Final Clinical Impressions(s) / ED Diagnoses   Final diagnoses:  Febrile neutropenia ([place])  Sore throat    New Prescriptions New Prescriptions   No medications on file     Debroah Baller 04/15/17 0630    Veryl Speak, MD 04/15/17 [phone]

## 2017-04-15 NOTE — ED Notes (Signed)
Report called to [facility] via Tax adviser (coordinator) [phone]  Opt 2

## 2017-04-15 NOTE — ED Notes (Signed)
Per [place] PA patient is ok to be transferred by private vehicle.

## 2017-04-15 NOTE — ED Notes (Signed)
Per Chong Sicilian, order needed for private vehicle transport with PORT accessed.

## 2017-04-15 NOTE — ED Triage Notes (Signed)
Patient here from home with complaints of fever that started this am when waking. Hx of ALL. Last chemo treatment x1 month.

## 2017-04-15 NOTE — Discharge Instructions (Signed)
Go to [facility] for admission for management of your febrile neutropenia. You have a bed on [address] [facility], room 20. Do not stop anywhere on your way to the facility.

## 2017-04-17 LAB — PATHOLOGIST SMEAR REVIEW

## 2017-04-20 LAB — CULTURE, BLOOD (SINGLE): Culture: NO GROWTH
